# Patient Record
Sex: Female | Born: 1975 | Smoking: Current every day smoker
Health system: Southern US, Community
[De-identification: ages and names within clinical notes are randomized; demographics above are authoritative.]

---

## 2016-02-27 ENCOUNTER — Ambulatory Visit (INDEPENDENT_AMBULATORY_CARE_PROVIDER_SITE_OTHER): Payer: 59 | Admitting: Osteopathic Medicine

## 2016-02-27 ENCOUNTER — Encounter: Payer: Self-pay | Admitting: Osteopathic Medicine

## 2016-02-27 VITALS — BP 110/63 | HR 62 | Ht 68.0 in | Wt 218.0 lb

## 2016-02-27 DIAGNOSIS — Z23 Encounter for immunization: Secondary | ICD-10-CM

## 2016-02-27 DIAGNOSIS — Z0000 Encounter for general adult medical examination without abnormal findings: Secondary | ICD-10-CM

## 2016-02-27 DIAGNOSIS — R229 Localized swelling, mass and lump, unspecified: Secondary | ICD-10-CM

## 2016-02-27 LAB — CBC WITH DIFFERENTIAL/PLATELET
BASOS PCT: 0 %
Basophils Absolute: 0 cells/uL (ref 0–200)
EOS ABS: 110 {cells}/uL (ref 15–500)
Eosinophils Relative: 1 %
HEMATOCRIT: 41.4 % (ref 35.0–45.0)
Hemoglobin: 13.7 g/dL (ref 11.7–15.5)
LYMPHS ABS: 1980 {cells}/uL (ref 850–3900)
Lymphocytes Relative: 18 %
MCH: 28.2 pg (ref 27.0–33.0)
MCHC: 33.1 g/dL (ref 32.0–36.0)
MCV: 85.4 fL (ref 80.0–100.0)
MONO ABS: 440 {cells}/uL (ref 200–950)
MPV: 10.4 fL (ref 7.5–12.5)
Monocytes Relative: 4 %
NEUTROS ABS: 8470 {cells}/uL — AB (ref 1500–7800)
Neutrophils Relative %: 77 %
Platelets: 373 10*3/uL (ref 140–400)
RBC: 4.85 MIL/uL (ref 3.80–5.10)
RDW: 13.8 % (ref 11.0–15.0)
WBC: 11 10*3/uL — ABNORMAL HIGH (ref 3.8–10.8)

## 2016-02-27 LAB — COMPLETE METABOLIC PANEL WITH GFR
ALT: 13 U/L (ref 6–29)
AST: 15 U/L (ref 10–30)
Albumin: 4 g/dL (ref 3.6–5.1)
Alkaline Phosphatase: 54 U/L (ref 33–115)
BILIRUBIN TOTAL: 0.6 mg/dL (ref 0.2–1.2)
BUN: 15 mg/dL (ref 7–25)
CALCIUM: 9.7 mg/dL (ref 8.6–10.2)
CO2: 27 mmol/L (ref 20–31)
CREATININE: 0.81 mg/dL (ref 0.50–1.10)
Chloride: 102 mmol/L (ref 98–110)
GFR, Est Non African American: >89 mL/min (ref >=60)
Glucose, Bld: 89 mg/dL (ref 65–99)
Potassium: 4.7 mmol/L (ref 3.5–5.3)
Sodium: 137 mmol/L (ref 135–146)
TOTAL PROTEIN: 6.8 g/dL (ref 6.1–8.1)

## 2016-02-27 LAB — LIPID PANEL
CHOLESTEROL: 178 mg/dL (ref 125–200)
HDL: 54 mg/dL (ref >=46)
LDL CALC: 90 mg/dL (ref <130)
TRIGLYCERIDES: 171 mg/dL — AB (ref <150)
Total CHOL/HDL Ratio: 3.3 Ratio (ref <=5.0)
VLDL: 34 mg/dL — ABNORMAL HIGH (ref <30)

## 2016-02-27 LAB — TSH: TSH: 2.62 mIU/L

## 2016-02-27 NOTE — Progress Notes (Signed)
HPI: Chelsey Oliver is a 40 y.o. female  who [facility] today, 02/27/16,  for chief complaint of:  Chief Complaint  Patient presents with  . Establish Care    LUMPS ON ARMS AND LEGS     . Context/Location: Has noted some subcutaneous lumps on right arm, right thigh, maybe one on left arm . Quality: Subcutaneous lumps, not terribly painful but are occasionally tender . Duration: Several months, no significant change over that time . Timing: . Assoc signs/symptoms: No rash or skin changes.    Past medical, surgical, social and family history reviewed: No past medical history on file. Past Surgical History:  Procedure Laterality Date  . CESAREAN SECTION     Social History  Substance Use Topics  . Smoking status: Current Every Day Smoker  . Smokeless tobacco: Never Used  . Alcohol use Not on file   Family History  Problem Relation Age of Onset  . Cancer Mother     LUNG  . Cancer Maternal Grandfather     COLON  . Heart attack Maternal Grandfather      Current medication list and allergy/intolerance information reviewed:   Current Outpatient Prescriptions  Medication Sig Dispense Refill  . levonorgestrel (MIRENA) 20 MCG/24HR IUD 1 each by Intrauterine route once.     No current facility-administered medications for this visit.    No Known Allergies    Review of Systems:  Constitutional:  No  fever, no chills, No recent illness, No unintentional weight changes. No significant fatigue.   HEENT: No  headache, no vision change, no hearing change, No sore throat, No  sinus pressure  Cardiac: No  chest pain, No  pressure, No palpitations, No  Orthopnea  Respiratory:  No  shortness of breath. No  Cough  Gastrointestinal: No  abdominal pain, No  nausea, No  vomiting,  No  blood in stool, No  diarrhea, No  constipation   Musculoskeletal: No new myalgia/arthralgia  Genitourinary: No  incontinence, No  abnormal genital bleeding,  No abnormal genital discharge  Skin: No  Rash, No other wounds/concerning lesions  Hem/Onc: No  easy bruising/bleeding, +abnormal lump/lymph node  Endocrine: No cold intolerance,  No heat intolerance. No polyuria/polydipsia/polyphagia   Neurologic: No  weakness, No  dizziness, No  slurred speech/focal weakness/facial droop  Psychiatric: No  concerns with depression, No  concerns with anxiety, No sleep problems, No mood problems  Exam:  BP 110/63   Pulse 62   Ht 5\' 8"  (1.727 m)   Wt 218 lb (98.9 kg)   BMI 33.15 kg/m   Constitutional: VS see above. General Appearance: alert, well-developed, well-nourished, NAD  Ears, Nose, Mouth, Throat: MMM,  Neck: No masses, trachea midline  Respiratory: Normal respiratory effort.   Musculoskeletal: Gait normal. No clubbing/cyanosis of digits.   Neurological:Normal balance/coordination. No tremor.   Skin: warm, dry, intact. No rash/ulcer. No concerning nevi on limited exam. Palpable/mildly tender subcutaneous nodules on right forearm and right lateral thigh, no overlying skin changes. See below for ultrasound results, I don't see any obvious cystic component to these lesions.  Psychiatric: Normal judgment/insight. Normal mood and affect. Oriented x3.   Procedure: Diagnostic Ultrasound of  right upper extremity and right lower extremity Device: GE Logiq E  Findings: Normal appearance to subcutaneous fatty tissue, no obvious cystic components Images permanently stored and available for review in the ultrasound unit.  Impression: Palpable masses without specific findings on bedside ultrasound, more likely lipoma  ASSESSMENT/PLAN: Subcutaneous masses probably benign given history/exam, I don't see any obvious cysts on the ultrasound, could be lipoma. Sent patient for formal ultrasound, advised they may recommend MRI for further evaluation but based on her history/exam and not convinced that this is warranted. Further discussion/plan will be  based on formal ultrasound  report  Subcutaneous mass - Plan: [place] Misc Soft Tissue  Annual physical exam - Plan: CBC with Differential/Platelet, COMPLETE METABOLIC PANEL WITH GFR, Lipid panel, TSH, VITAMIN D 25 Hydroxy (Vit-D Deficiency, Fractures), HIV antibody  Need for prophylactic vaccination and inoculation against influenza - Plan: Flu Vaccine QUAD 36+ mos IM   Patient Instructions  Plan:  We will get a set up for ultrasound for further evaluation of these cutaneous masses. My suspicion for cancer or other dangerous abnormality is pretty low based on your history and my exam. Ultrasound may show lipoma, benign fatty tumor, in which case we can send referral to general surgeon to discuss removal if these are bothering you. Depending on how the ultrasound is looking, an MRI may be recommended, and we can talk about this further if that is the case.   Plan to return to the clinic for annual physical exam to discuss routine preventive care/health maintenance. Labs were ordered for this today, as long as no major abnormalities on the labs we can discuss your results in detail at your annual.     Visit summary with medication list and pertinent instructions was printed for patient to review. All questions at time of visit were answered - patient instructed to contact office with any additional concerns. ER/RTC precautions were reviewed with the patient. Follow-up plan: Return if symptoms worsen or fail to improve and at your convenience for annual physical .  Please note: Labs were ordered for future annual physical, annual physical was not billed at this time

## 2016-02-27 NOTE — Patient Instructions (Signed)
Plan:  We will get a set up for ultrasound for further evaluation of these cutaneous masses. My suspicion for cancer or other dangerous abnormality is pretty low based on your history and my exam. Ultrasound may show lipoma, benign fatty tumor, in which case we can send referral to general surgeon to discuss removal if these are bothering you. Depending on how the ultrasound is looking, an MRI may be recommended, and we can talk about this further if that is the case.   Plan to return to the clinic for annual physical exam to discuss routine preventive care/health maintenance. Labs were ordered for this today, as long as no major abnormalities on the labs we can discuss your results in detail at your annual.

## 2016-02-28 ENCOUNTER — Ambulatory Visit (INDEPENDENT_AMBULATORY_CARE_PROVIDER_SITE_OTHER): Payer: 59

## 2016-02-28 DIAGNOSIS — R229 Localized swelling, mass and lump, unspecified: Secondary | ICD-10-CM

## 2016-02-28 LAB — VITAMIN D 25 HYDROXY (VIT D DEFICIENCY, FRACTURES): VIT D 25 HYDROXY: 20 ng/mL — AB (ref 30–100)

## 2016-02-28 LAB — HIV ANTIBODY (ROUTINE TESTING W REFLEX): HIV: NONREACTIVE

## 2018-09-04 IMAGING — US US MISC SOFT TISSUE
1 series · 14 of 16 positions shown · non-contrast
Comparison: None

CLINICAL DATA: Multiple subcutaneous nodules.  Question lipoma

EXAM:
ULTRASOUND RIGHT FOREARM, LEFT FOREARM, RIGHT THIGH, LIMITED
TECHNIQUE: Ultrasound examination of the upper extremity soft tissues was
performed in the area of clinical concern.

[Series 1: us misc soft tissue · 0.03mm/px · 14 of 24 slices shown]
[im 1/24]
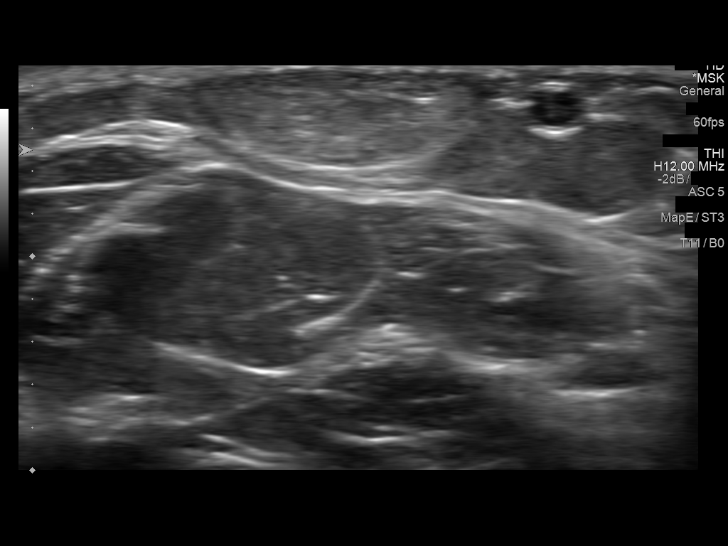
[im 2/24]
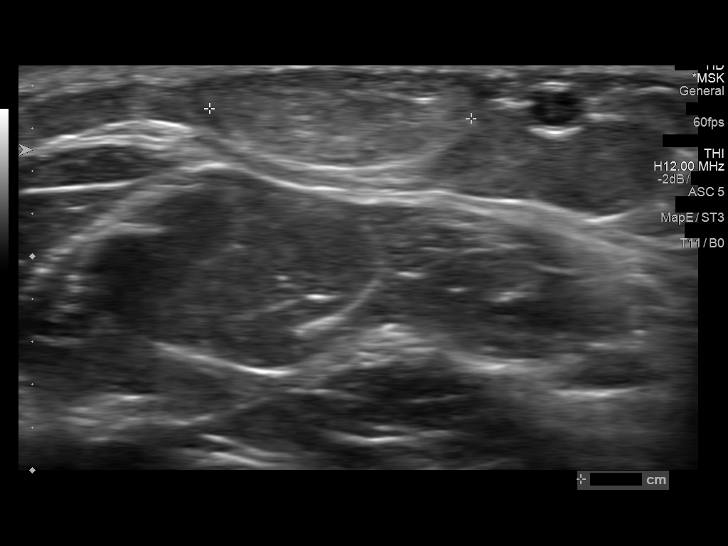
[im 4/24]
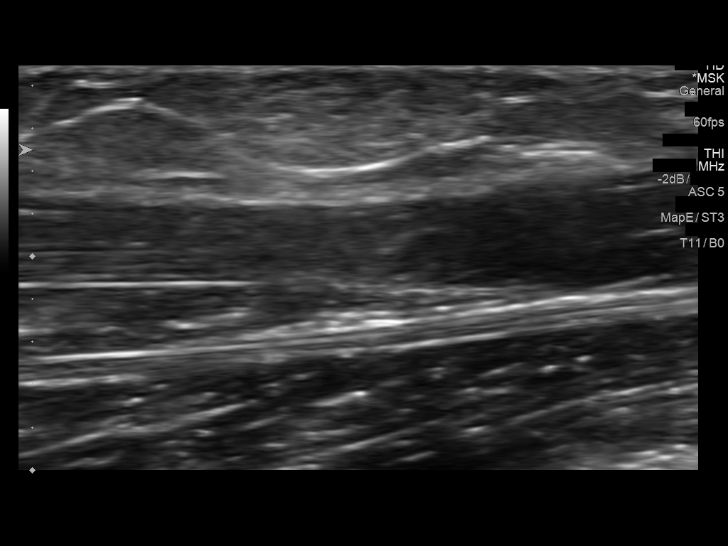
[im 7/24]
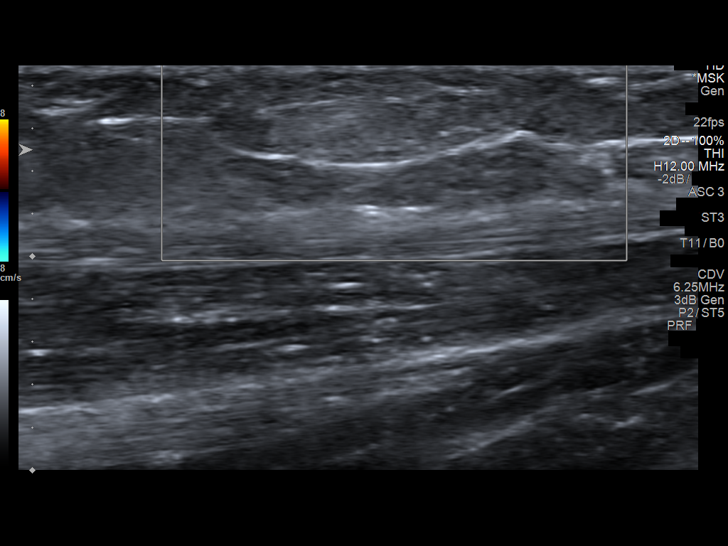
[im 8/24]
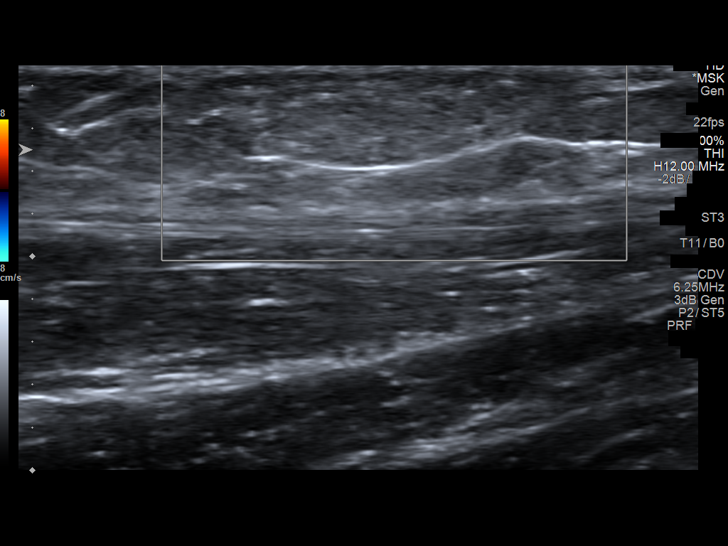
[im 10/24]
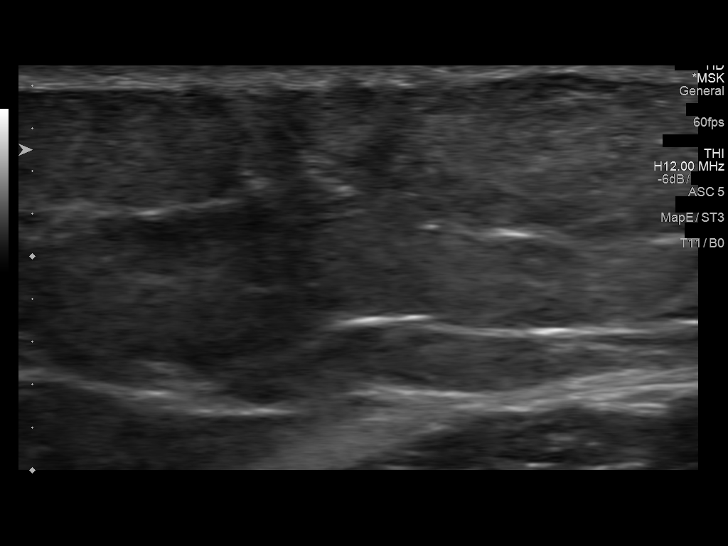
[im 11/24]
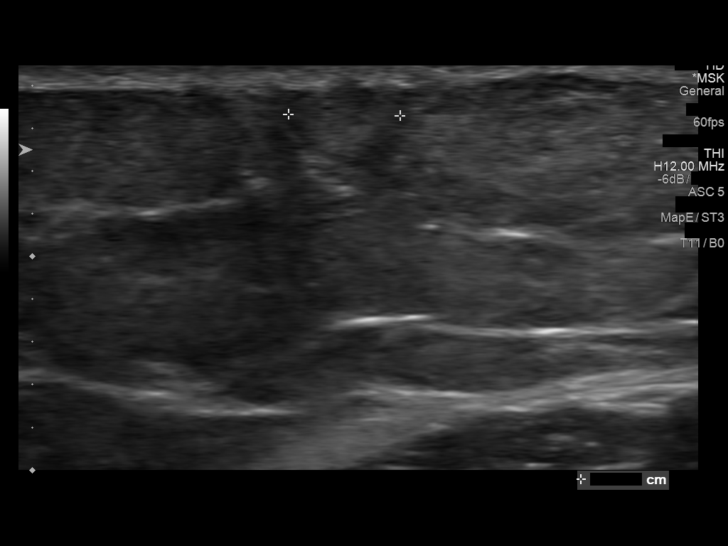
[im 13/24]
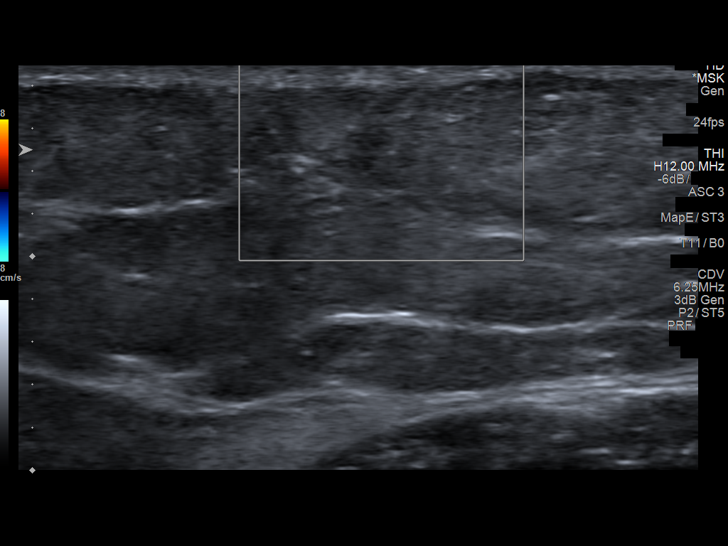
[im 14/24]
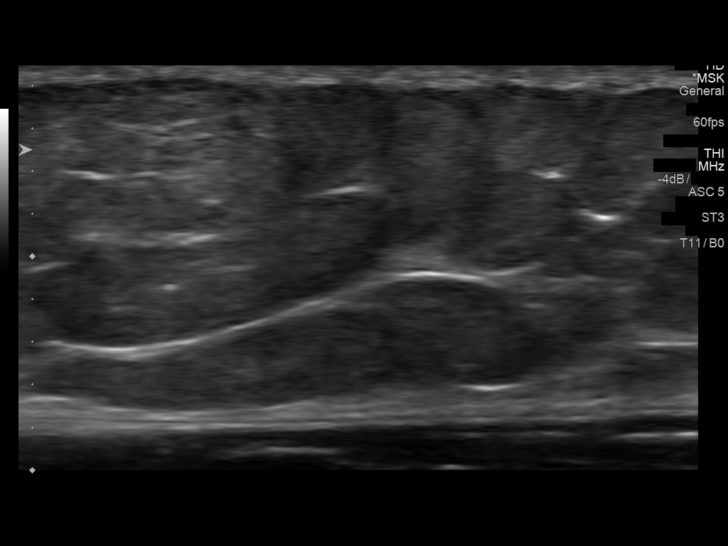
[im 16/24]
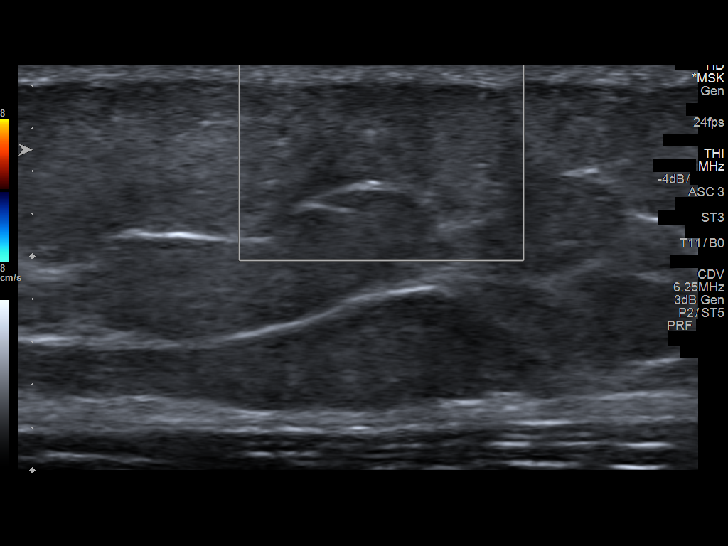
[im 19/24]
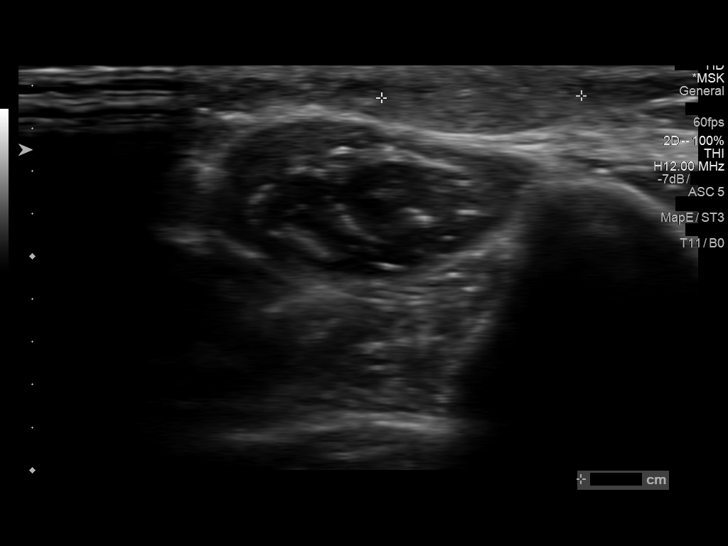
[im 20/24]
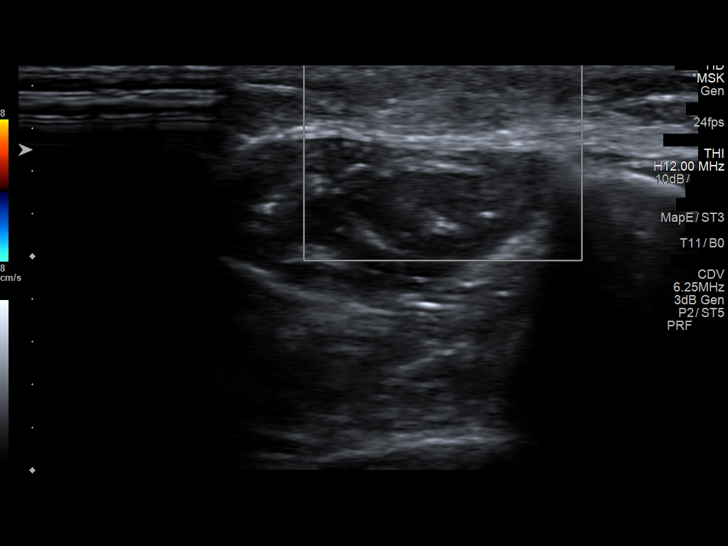
[im 22/24]
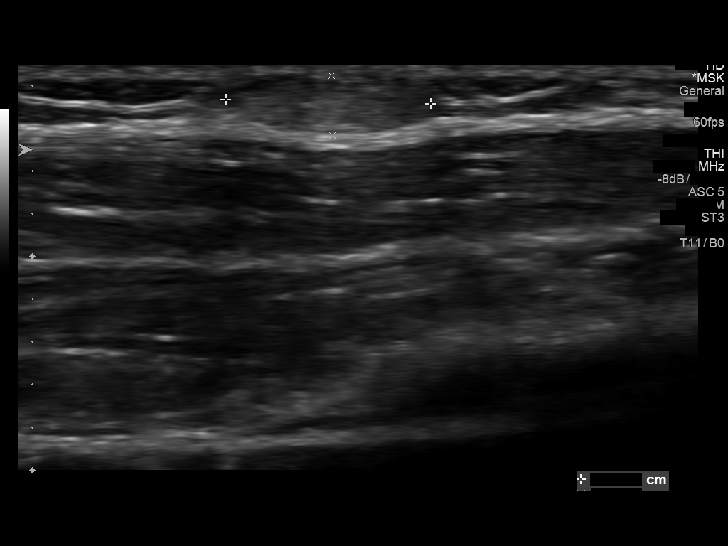
[im 24/24]
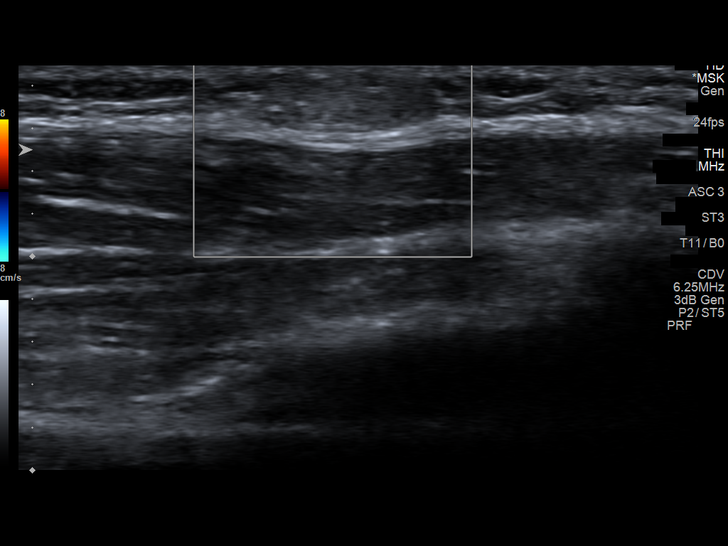

[14 of 16 positions shown; findings below may reference images not displayed]

FINDINGS: Right anterior forearm subcutaneous nodule measures 15 x 5 x 12 mm
and is mildly hyperechoic to surrounding fat. No cystic component.

Right lateral thigh subcutaneous nodule is ill-defined and measures
5 x 4 x 5 mm. This is mildly hyperechoic relative to fat. No cystic
component.

Left medial forearm subcutaneous nodule measures 10 x 3 x 9 mm. This
is ill-defined and not well differentiated from the surrounding
subcutaneous fat. No cystic component.
IMPRESSION: Multiple subcutaneous nodules of similar ultrasound appearance and
are solid and non cystic. They are mildly hyperechoic and may
represent fatty tissue. If the nodules were to grow or exhibit other
symptoms, biopsy should be considered.
